# Patient Record
Sex: Male | Born: 1990 | Race: White | Hispanic: No | Marital: Single | Smoking: Current every day smoker
Health system: Southern US, Community
[De-identification: ages and names within clinical notes are randomized; demographics above are authoritative.]

## PROBLEM LIST (undated history)

## (undated) DIAGNOSIS — S3992XA Unspecified injury of lower back, initial encounter: Secondary | ICD-10-CM

## (undated) HISTORY — PX: KNEE SURGERY: SHX244

---
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
## 2015-10-30 ENCOUNTER — Emergency Department (HOSPITAL_COMMUNITY): Payer: No Typology Code available for payment source

## 2015-10-30 ENCOUNTER — Emergency Department (HOSPITAL_COMMUNITY)
Admission: EM | Admit: 2015-10-30 | Discharge: 2015-10-30 | Disposition: A | Discharge status: Home / Self Care | Payer: No Typology Code available for payment source | Attending: Emergency Medicine | Admitting: Emergency Medicine

## 2015-10-30 ENCOUNTER — Encounter (HOSPITAL_COMMUNITY): Payer: Self-pay | Admitting: Emergency Medicine

## 2015-10-30 DIAGNOSIS — S80211A Abrasion, right knee, initial encounter: Secondary | ICD-10-CM | POA: Diagnosis not present

## 2015-10-30 DIAGNOSIS — G8929 Other chronic pain: Secondary | ICD-10-CM | POA: Insufficient documentation

## 2015-10-30 DIAGNOSIS — S32010A Wedge compression fracture of first lumbar vertebra, initial encounter for closed fracture: Secondary | ICD-10-CM | POA: Insufficient documentation

## 2015-10-30 DIAGNOSIS — S80212A Abrasion, left knee, initial encounter: Secondary | ICD-10-CM | POA: Diagnosis not present

## 2015-10-30 DIAGNOSIS — Y998 Other external cause status: Secondary | ICD-10-CM | POA: Insufficient documentation

## 2015-10-30 DIAGNOSIS — Y9389 Activity, other specified: Secondary | ICD-10-CM | POA: Insufficient documentation

## 2015-10-30 DIAGNOSIS — S2220XA Unspecified fracture of sternum, initial encounter for closed fracture: Secondary | ICD-10-CM | POA: Diagnosis not present

## 2015-10-30 DIAGNOSIS — Y92410 Unspecified street and highway as the place of occurrence of the external cause: Secondary | ICD-10-CM | POA: Diagnosis not present

## 2015-10-30 DIAGNOSIS — F17210 Nicotine dependence, cigarettes, uncomplicated: Secondary | ICD-10-CM | POA: Diagnosis not present

## 2015-10-30 DIAGNOSIS — S3992XA Unspecified injury of lower back, initial encounter: Secondary | ICD-10-CM | POA: Diagnosis present

## 2015-10-30 HISTORY — DX: Unspecified injury of lower back, initial encounter: S39.92XA

## 2015-10-30 LAB — CBC WITH DIFFERENTIAL/PLATELET
BASOS ABS: 0.1 10*3/uL (ref 0.0–0.1)
Basophils Relative: 1 %
EOS ABS: 0.1 10*3/uL (ref 0.0–0.7)
EOS PCT: 1 %
HCT: 47.1 % (ref 39.0–52.0)
HEMOGLOBIN: 15.9 g/dL (ref 13.0–17.0)
LYMPHS PCT: 15 %
Lymphs Abs: 1.2 10*3/uL (ref 0.7–4.0)
MCH: 31.7 pg (ref 26.0–34.0)
MCHC: 33.8 g/dL (ref 30.0–36.0)
MCV: 94 fL (ref 78.0–100.0)
Monocytes Absolute: 0.7 10*3/uL (ref 0.1–1.0)
Monocytes Relative: 9 %
NEUTROS PCT: 74 %
Neutro Abs: 6.1 10*3/uL (ref 1.7–7.7)
PLATELETS: 166 10*3/uL (ref 150–400)
RBC: 5.01 MIL/uL (ref 4.22–5.81)
RDW: 12.4 % (ref 11.5–15.5)
WBC: 8.2 10*3/uL (ref 4.0–10.5)

## 2015-10-30 LAB — COMPREHENSIVE METABOLIC PANEL
ALT: 36 U/L (ref 17–63)
AST: 46 U/L — AB (ref 15–41)
Albumin: 3.8 g/dL (ref 3.5–5.0)
Alkaline Phosphatase: 70 U/L (ref 38–126)
Anion gap: 10 (ref 5–15)
CHLORIDE: 102 mmol/L (ref 101–111)
CO2: 26 mmol/L (ref 22–32)
CREATININE: 0.98 mg/dL (ref 0.61–1.24)
Calcium: 8.6 mg/dL — ABNORMAL LOW (ref 8.9–10.3)
GFR calc Af Amer: >60 mL/min (ref >60)
GFR calc non Af Amer: >60 mL/min (ref >60)
Glucose, Bld: 100 mg/dL — ABNORMAL HIGH (ref 65–99)
Potassium: 3.5 mmol/L (ref 3.5–5.1)
SODIUM: 138 mmol/L (ref 135–145)
Total Bilirubin: 0.5 mg/dL (ref 0.3–1.2)
Total Protein: 7 g/dL (ref 6.5–8.1)

## 2015-10-30 LAB — I-STAT CHEM 8, ED
BUN: 4 mg/dL — AB (ref 6–20)
CHLORIDE: 100 mmol/L — AB (ref 101–111)
CREATININE: 1.1 mg/dL (ref 0.61–1.24)
Calcium, Ion: 1.02 mmol/L — ABNORMAL LOW (ref 1.12–1.23)
Glucose, Bld: 91 mg/dL (ref 65–99)
HEMATOCRIT: 51 % (ref 39.0–52.0)
HEMOGLOBIN: 17.3 g/dL — AB (ref 13.0–17.0)
POTASSIUM: 3.6 mmol/L (ref 3.5–5.1)
Sodium: 140 mmol/L (ref 135–145)
TCO2: 25 mmol/L (ref 0–100)

## 2015-10-30 LAB — URINALYSIS, ROUTINE W REFLEX MICROSCOPIC
Bilirubin Urine: NEGATIVE
GLUCOSE, UA: NEGATIVE mg/dL
HGB URINE DIPSTICK: NEGATIVE
Ketones, ur: NEGATIVE mg/dL
Leukocytes, UA: NEGATIVE
Nitrite: NEGATIVE
PH: 7.5 (ref 5.0–8.0)
Protein, ur: NEGATIVE mg/dL
SPECIFIC GRAVITY, URINE: 1.02 (ref 1.005–1.030)

## 2015-10-30 LAB — I-STAT TROPONIN, ED: Troponin i, poc: 0 ng/mL (ref 0.00–0.08)

## 2015-10-30 LAB — I-STAT CG4 LACTIC ACID, ED: Lactic Acid, Venous: 1.35 mmol/L (ref 0.5–2.0)

## 2015-10-30 LAB — LIPASE, BLOOD: Lipase: 39 U/L (ref 11–51)

## 2015-10-30 MED ORDER — OXYCODONE-ACETAMINOPHEN 5-325 MG PO TABS: 1.0000 | ORAL_TABLET | Freq: Three times a day (TID) | ORAL | Status: AC | PRN

## 2015-10-30 MED ORDER — HYDROMORPHONE HCL 1 MG/ML IJ SOLN
1.0000 mg | Freq: Once | INTRAMUSCULAR | Status: AC
Start: 2015-10-30 — End: 2015-10-30
  Administered 2015-10-30: 1 mg via INTRAVENOUS
  Filled 2015-10-30: qty 1

## 2015-10-30 MED ORDER — MORPHINE SULFATE (PF) 4 MG/ML IV SOLN
6.0000 mg | Freq: Once | INTRAVENOUS | Status: AC
  Administered 2015-10-30: 6 mg via INTRAVENOUS
  Filled 2015-10-30: qty 2

## 2015-10-30 MED ORDER — SODIUM CHLORIDE 0.9 % IV BOLUS (SEPSIS)
1000.0000 mL | Freq: Once | INTRAVENOUS | Status: AC
  Administered 2015-10-30: 1000 mL via INTRAVENOUS

## 2015-10-30 MED ORDER — OXYCODONE-ACETAMINOPHEN 5-325 MG PO TABS
2.0000 | ORAL_TABLET | Freq: Once | ORAL | Status: AC
Start: 2015-10-30 — End: 2015-10-30
  Administered 2015-10-30: 2 via ORAL
  Filled 2015-10-30: qty 2

## 2015-10-30 MED ORDER — ONDANSETRON HCL 4 MG/2ML IJ SOLN
4.0000 mg | Freq: Once | INTRAMUSCULAR | Status: AC
  Administered 2015-10-30: 4 mg via INTRAVENOUS
  Filled 2015-10-30: qty 2

## 2015-10-30 MED ORDER — IOPAMIDOL (ISOVUE-300) INJECTION 61%
INTRAVENOUS | Status: AC
  Administered 2015-10-30: 75 mL
  Filled 2015-10-30: qty 100
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
## 2015-10-30 NOTE — ED Notes (Signed)
[employer] at bedside applying back brace
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
## 2015-10-30 NOTE — ED Notes (Signed)
Pt ambulated in hallway w/o difficulty or dizziness.

## 2016-11-07 DIAGNOSIS — K5900 Constipation, unspecified: Secondary | ICD-10-CM | POA: Diagnosis not present

## 2017-04-03 DIAGNOSIS — H6120 Impacted cerumen, unspecified ear: Secondary | ICD-10-CM | POA: Diagnosis not present

## 2017-04-03 DIAGNOSIS — H65192 Other acute nonsuppurative otitis media, left ear: Secondary | ICD-10-CM | POA: Diagnosis not present
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
## 2017-10-11 DIAGNOSIS — J0140 Acute pansinusitis, unspecified: Secondary | ICD-10-CM | POA: Diagnosis not present

## 2017-10-11 DIAGNOSIS — J302 Other seasonal allergic rhinitis: Secondary | ICD-10-CM | POA: Diagnosis not present

## 2018-10-24 DIAGNOSIS — R197 Diarrhea, unspecified: Secondary | ICD-10-CM | POA: Diagnosis not present

## 2018-10-24 DIAGNOSIS — J069 Acute upper respiratory infection, unspecified: Secondary | ICD-10-CM | POA: Diagnosis not present

## 2018-10-24 DIAGNOSIS — J029 Acute pharyngitis, unspecified: Secondary | ICD-10-CM | POA: Diagnosis not present

## 2019-01-13 ENCOUNTER — Other Ambulatory Visit (HOSPITAL_BASED_OUTPATIENT_CLINIC_OR_DEPARTMENT_OTHER): Payer: Self-pay | Admitting: Nurse Practitioner

## 2019-01-13 DIAGNOSIS — Q678 Other congenital deformities of chest: Secondary | ICD-10-CM

## 2019-01-22 ENCOUNTER — Ambulatory Visit (HOSPITAL_BASED_OUTPATIENT_CLINIC_OR_DEPARTMENT_OTHER): Payer: 59
